# Patient Record
Sex: Male | Born: 1950
Health system: Southern US, Community
[De-identification: ages and names within clinical notes are randomized; demographics above are authoritative.]

## PROBLEM LIST (undated history)

## (undated) DIAGNOSIS — R251 Tremor, unspecified: Secondary | ICD-10-CM

## (undated) DIAGNOSIS — E7800 Pure hypercholesterolemia, unspecified: Secondary | ICD-10-CM

## (undated) HISTORY — DX: Pure hypercholesterolemia, unspecified: E78.00

## (undated) HISTORY — DX: Tremor, unspecified: R25.1

---
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
## 2004-10-02 ENCOUNTER — Encounter: Admission: RE | Admit: 2004-10-02 | Discharge: 2004-11-15 | Payer: Self-pay | Admitting: Neurosurgery

## 2015-09-17 HISTORY — PX: COLONOSCOPY: SHX174

## 2016-01-22 ENCOUNTER — Encounter: Payer: Self-pay | Admitting: Neurology

## 2016-01-22 ENCOUNTER — Ambulatory Visit (INDEPENDENT_AMBULATORY_CARE_PROVIDER_SITE_OTHER): Payer: BLUE CROSS/BLUE SHIELD | Admitting: Neurology

## 2016-01-22 ENCOUNTER — Telehealth: Payer: Self-pay | Admitting: Neurology

## 2016-01-22 VITALS — BP 114/68 | HR 54 | Ht 66.0 in | Wt 185.0 lb

## 2016-01-22 DIAGNOSIS — R251 Tremor, unspecified: Secondary | ICD-10-CM | POA: Insufficient documentation

## 2016-01-22 NOTE — Telephone Encounter (Signed)
John Pace called 8:05am, is running late for 8:00am arrival time for 8:30am appointment, due to traffice, will be here in approximately 5-10 minutes.

## 2016-01-22 NOTE — Patient Instructions (Signed)
Tremor °A tremor is trembling or shaking that you cannot control. Most tremors affect the hands or arms. Tremors can also affect the head, vocal cords, face, and other parts of the body. There are many types of tremors. Common types include:  °· Essential tremor. These usually occur in people over the age of 40. It may run in families and can happen in otherwise healthy people.   °· Resting tremor. These occur when the muscles are at rest, such as when your hands are resting in your lap. People with Parkinson disease often have resting tremors.   °· Postural tremor. These occur when you try to hold a pose, such as keeping your hands outstretched.   °· Kinetic tremor. These occur during purposeful movement, such as trying to touch a finger to your nose.   °· Task-specific tremor. These may occur when you perform tasks such as handwriting, speaking, or standing.   °· Psychogenic tremor. These dramatically lessen or disappear when you are distracted. They can happen in people of all ages.   °Some types of tremors have no known cause. Tremors can also be a symptom of nervous system problems (neurological disorders) that may occur with aging. Some tremors go away with treatment while others do not.  °HOME CARE INSTRUCTIONS °Watch your tremor for any changes. The following actions may help to lessen any discomfort you are feeling: °· Take medicines only as directed by your health care provider.   °· Limit alcohol intake to no more than 1 drink per day for nonpregnant women and 2 drinks per day for men. One drink equals 12 oz of beer, 5 oz of wine, or 1½ oz of hard liquor. °· Do not use any tobacco products, including cigarettes, chewing tobacco, or electronic cigarettes. If you need help quitting, ask your health care provider.   °· Avoid extreme heat or cold.    °· Limit the amount of caffeine you consume as directed by your health care provider.   °· Try to get 8 hours of sleep each night. °· Find ways to manage your  stress, such as meditation or yoga. °· Keep all follow-up visits as directed by your health care provider. This is important. °SEEK MEDICAL CARE IF: °· You start having a tremor after starting a new medicine. °· You have tremor with other symptoms such as: °¨ Numbness. °¨ Tingling. °¨ Pain. °¨ Weakness. °· Your tremor gets worse. °· Your tremor interferes with your day-to-day life. °  °This information is not intended to replace advice given to you by your health care provider. Make sure you discuss any questions you have with your health care provider. °  °Document Released: 08/23/2002 Document Revised: 09/23/2014 Document Reviewed: 02/28/2014 °Elsevier Interactive Patient Education ©2016 [employer] ° °

## 2016-01-22 NOTE — Progress Notes (Signed)
Reason for visit: Tremor  Referring physician: Dr. Margarita Rana is a 65 y.o. male  History of present illness:  John Pace is a 65 year old right-handed white male with a history of tremor that he has noticed off and on over the last 3-4 months. The patient indicates that the tremor is not present at all times, he will generally note the tremor more often when he is resting his arms, usually more on the right arm than the left arm. Occasionally he may note some tremor when he is trying to comb his hair. He oftentimes will have a tremor while he is driving a car. He indicates that he has had no real change in handwriting, he denies any issues with feeding himself. He has not noted any change in his walking, balance, and no problems with speech or swallowing. He denies issues controlling the bowels or the bladder. He denies any family history of tremor other than the fact that his father had some tremor after a stroke when he was in his 90s. Otherwise, the patient has been quite healthy, he is essentially on no medications. He is sent to this office for an evaluation. He denies any tremor of the head and neck, legs, or any vocal tremor.  Past Medical History  Diagnosis Date  . Tremor   . High cholesterol     Past Surgical History  Procedure Laterality Date  . Stomach surgery  1952    pyloic stenosis  . Colonoscopy  2017    Family History  Problem Relation Age of Onset  . Stroke Father   . Lung cancer Mother     Social history:  reports that he has never smoked. He does not have any smokeless tobacco history on file. He reports that he drinks alcohol. He reports that he does not use illicit drugs.  Medications:  Prior to Admission medications   Not on File     No Known Allergies  ROS:  Out of a complete 14 system review of symptoms, the patient complains only of the following symptoms, and all other reviewed systems are negative.  Tremor  Blood pressure 114/68,  pulse 54, height 5\' 6"  (1.676 m), weight 185 lb (83.915 kg).  Physical Exam  General: The patient is alert and cooperative at the time of the examination.  Eyes: Pupils are equal, round, and reactive to light. Discs are flat bilaterally.  Neck: The neck is supple, no carotid bruits are noted.  Respiratory: The respiratory examination is clear.  Cardiovascular: The cardiovascular examination reveals a regular rate and rhythm, no obvious murmurs or rubs are noted.  Skin: Extremities are without significant edema.  Neurologic Exam  Mental status: The patient is alert and oriented x 3 at the time of the examination. The patient has apparent normal recent and remote memory, with an apparently normal attention span and concentration ability.  Cranial nerves: Facial symmetry is present. There is good sensation of the face to pinprick and soft touch bilaterally. The strength of the facial muscles and the muscles to head turning and shoulder shrug are normal bilaterally. Speech is well enunciated, no aphasia or dysarthria is noted. Extraocular movements are full. Visual fields are full. The tongue is midline, and the patient has symmetric elevation of the soft palate. No obvious hearing deficits are noted.  Motor: The motor testing reveals 5 over 5 strength of all 4 extremities. Good symmetric motor tone is noted throughout.  Sensory: Sensory testing is intact  to pinprick, soft touch, vibration sensation, and position sense on all 4 extremities. No evidence of extinction is noted.  Coordination: Cerebellar testing reveals good finger-nose-finger and heel-to-shin bilaterally. At times, a resting tremor involving the right upper extremity is seen.  Gait and station: Gait is normal. There is good arm swing bilaterally, there is a question of minimal depression of arm swing on the right as compared to the left. Tandem gait is normal. Romberg is negative. No drift is seen.  Reflexes: Deep tendon  reflexes are symmetric and normal bilaterally. Toes are downgoing bilaterally.   Assessment/Plan:  1. Resting tremor, right upper extremity  The patient has a resting tremor involving the right arm, no other associated abnormalities on clinical examination. The etiology of the tremor is not clear, MRI of the brain will be set up. The patient will need to be followed over time looking for developing symptoms of parkinsonism. He will follow-up in 6 months.   John Alexanders MD 01/22/2016 7:47 PM  [facility] [address] [place], [place] [address]  Phone [phone] Fax [phone]

## 2016-02-14 ENCOUNTER — Other Ambulatory Visit: Payer: BLUE CROSS/BLUE SHIELD

## 2016-02-14 ENCOUNTER — Ambulatory Visit (INDEPENDENT_AMBULATORY_CARE_PROVIDER_SITE_OTHER): Payer: BLUE CROSS/BLUE SHIELD

## 2016-02-14 DIAGNOSIS — R251 Tremor, unspecified: Secondary | ICD-10-CM

## 2016-02-16 ENCOUNTER — Telehealth: Payer: Self-pay | Admitting: Neurology

## 2016-02-16 NOTE — Telephone Encounter (Signed)
I called the patient. MRI the brain is essentially normal, very very minimal white matter changes. No etiology for tremor seen. The patient has a true unilateral resting tremor, will follow over time looking for developing signs of parkinsonism. I discussed this with the patient.   MRI brain 02/15/16:  IMPRESSION:  Equivocal MRI brain (without) demonstrating: 1. Minimal periventricular and subcortical foci of non-specific gliosis.  2. No acute findings.

## 2016-07-24 ENCOUNTER — Encounter: Payer: Self-pay | Admitting: Neurology

## 2016-07-24 ENCOUNTER — Ambulatory Visit (INDEPENDENT_AMBULATORY_CARE_PROVIDER_SITE_OTHER): Payer: BLUE CROSS/BLUE SHIELD | Admitting: Neurology

## 2016-07-24 VITALS — BP 110/58 | HR 52 | Resp 16 | Ht 66.0 in | Wt 188.0 lb

## 2016-07-24 DIAGNOSIS — R251 Tremor, unspecified: Secondary | ICD-10-CM | POA: Diagnosis not present

## 2016-07-24 NOTE — Progress Notes (Signed)
Reason for visit: Tremor  John Pace is an 65 y.o. male  History of present illness:  John Pace is a 65 year old right-handed white male with a history of a tremor. The patient indicates that the tremor affects both hands equally, he will notice it with handwriting and with holding objects. If he relaxes completely, the tremors go away. He will have good days and bad days with the tremor. If he is under some stress, such as giving a talk to other individuals at work, the tremor may be more prominent. The patient has no problem using a computer, he has not had to alter any of his day-to-day functioning because of the tremor. He now indicates that his father had a tremor late in life. The patient returns to this office for an evaluation. The patient reports no problems walking or with balance. There have been no changes in speech or swallowing.  Past Medical History:  Diagnosis Date  . High cholesterol   . Tremor     Past Surgical History:  Procedure Laterality Date  . COLONOSCOPY  2017  . STOMACH SURGERY  1952   pyloic stenosis    Family History  Problem Relation Age of Onset  . Stroke Father   . Lung cancer Mother     Social history:  reports that he has never smoked. He does not have any smokeless tobacco history on file. He reports that he drinks alcohol. He reports that he does not use drugs.   No Known Allergies  Medications:  Prior to Admission medications   Medication Sig Start Date End Date Taking? Authorizing Provider  ibuprofen (ADVIL,MOTRIN) 200 MG tablet Take 200 mg by mouth every 8 (eight) hours as needed.   Yes Historical Provider, MD    ROS:  Out of a complete 14 system review of symptoms, the patient complains only of the following symptoms, and all other reviewed systems are negative.  Tremor  Blood pressure (!) 110/58, pulse (!) 52, resp. rate 16, height 5\' 6"  (1.676 m), weight 188 lb (85.3 kg).  Physical Exam  General: The patient is alert and  cooperative at the time of the examination.  Skin: No significant peripheral edema is noted.   Neurologic Exam  Mental status: The patient is alert and oriented x 3 at the time of the examination. The patient has apparent normal recent and remote memory, with an apparently normal attention span and concentration ability.   Cranial nerves: Facial symmetry is present. Speech is normal, no aphasia or dysarthria is noted. Extraocular movements are full. Visual fields are full.  Motor: The patient has good strength in all 4 extremities.  Sensory examination: Soft touch sensation is symmetric on the face, arms, and legs.  Coordination: The patient has good finger-nose-finger and heel-to-shin bilaterally. Mild tremors are seen in both hands with arms outstretched. The tremor is not translated into drawing a spiral.  Gait and station: The patient has a normal gait. Tandem gait is normal. Romberg is negative. No drift is seen.  Reflexes: Deep tendon reflexes are symmetric.   Assessment/Plan:  1. Probable essential tremor  There is no evidence of parkinsonism. The patient reports good symmetry of the tremor, the tremor comes on with activity involving the arms, goes away with rest. The tremor appears to conform to an essential tremor. The tremor is quite minimal at this time, I will have the patient contact me if he believes that he will require treatment. The patient will follow-up if needed.  Jill Alexanders MD 07/24/2016 8:02 AM  [facility] [address] [place], [place] [address]  Phone [phone] Fax [phone]

## 2016-10-07 ENCOUNTER — Telehealth: Payer: Self-pay | Admitting: Neurology

## 2016-10-07 MED ORDER — PREDNISONE 10 MG PO TABS: ORAL_TABLET | ORAL | 0 refills | Status: DC

## 2016-10-07 NOTE — Telephone Encounter (Signed)
Patient is calling stating he is has been having pain in his lower back since 09-23-16. He went to see his PCP on 09-23-16 and was given steroids. The pain was so bad then he could not walk. He went to an urgent care on 10-05-16 because his PCP was not in the office and was given  a muscle relaxer. The patient is still in severe pain and is concerned this may be related to tremors. Please call and discuss.

## 2016-10-07 NOTE — Telephone Encounter (Signed)
Pt was seen for OV in Nov for essential-type tremor of the arms. Now c/o severe low back pain despite prednisone, cyclobenzaprine, methocarbamol/ibuprofen.

## 2016-10-07 NOTE — Addendum Note (Signed)
Addended by: Margette Fast on: 10/07/2016 10:29 AM   Modules accepted: Orders

## 2016-10-07 NOTE — Telephone Encounter (Signed)
I called patient. The patient has had back pain over the last couple weeks, a prednisone Dosepak initially helped him, but the pain came back after coming off the medication, he is also on Robaxin. I will repeat the dose pack of prednisone, and get him in for a work in revisit.  The patient denies any discomfort going down either leg.

## 2016-10-09 NOTE — Telephone Encounter (Signed)
I called patient. He is feeling somewhat better on the prednisone, okay to enter into some light walking.

## 2016-10-09 NOTE — Telephone Encounter (Signed)
Called pt and appt scheduled next Tues. Says that he started prednisone yesterday and it has helped with the back pain. However, he's having a twinge-type sensation in his legs that began the day before starting the dosepak. Mentioned that he had stopped walking on the treadmill d/t severe back pain. Would like Dr. Jannifer Franklin' advice on whether to re-start his exercise program since pain has improved.

## 2016-10-15 ENCOUNTER — Ambulatory Visit (INDEPENDENT_AMBULATORY_CARE_PROVIDER_SITE_OTHER): Payer: BLUE CROSS/BLUE SHIELD | Admitting: Neurology

## 2016-10-15 ENCOUNTER — Encounter: Payer: Self-pay | Admitting: Neurology

## 2016-10-15 VITALS — BP 123/65 | HR 54 | Ht 66.0 in | Wt 181.0 lb

## 2016-10-15 DIAGNOSIS — M545 Low back pain, unspecified: Secondary | ICD-10-CM | POA: Insufficient documentation

## 2016-10-15 DIAGNOSIS — R251 Tremor, unspecified: Secondary | ICD-10-CM

## 2016-10-15 NOTE — Progress Notes (Signed)
Reason for visit: Low back pain  John Pace is an 66 y.o. male  History of present illness:  John Pace is a 66 year old right-handed white male with a history of a mild essential tremor. The tremor affects both upper extremities, and has so far not affected his ability to function day to day. Within the last 3 or 4 weeks, he has developed a new problem which involves low back pain. He has had neck and back pain years ago that resolved with traction and with epidural steroid injections. The patient has had onset of severe back pain mainly on the right side of the low back, the patient may have some occasional tingling sensations into the anterior thighs bilaterally without any weakness of the extremities. The patient denies any difficulty controlling the bowels or the bladder. The patient has not had any true weakness of the legs, he has not had any balance issues. The patient was placed on a prednisone dosepak and he was placed on Robaxin as a muscle relaxer which initially seemed to help, but when he went off of the prednisone the pain returned. The patient has been placed back on a prednisone dosepak, he is still on the pack, and he is now pain-free. The patient is getting back into low-grade walking exercises. He returns this office for an evaluation.  Past Medical History:  Diagnosis Date  . High cholesterol   . Tremor     Past Surgical History:  Procedure Laterality Date  . COLONOSCOPY  2017  . STOMACH SURGERY  1952   pyloic stenosis    Family History  Problem Relation Age of Onset  . Stroke Father   . Lung cancer Mother     Social history:  reports that he has never smoked. He has never used smokeless tobacco. He reports that he drinks alcohol. He reports that he does not use drugs.   No Known Allergies  Medications:  Prior to Admission medications   Medication Sig Start Date End Date Taking? Authorizing Provider  cyclobenzaprine (FLEXERIL) 10 MG tablet TAKE 1 TABLET  AS NEEDED TWO TIMES DAILY AS NEEDED ORALLY 10 DAYS 09/23/16  Yes Historical Provider, MD  ibuprofen (ADVIL,MOTRIN) 200 MG tablet Take 200 mg by mouth every 8 (eight) hours as needed.   Yes Historical Provider, MD  methocarbamol (ROBAXIN) 500 MG tablet Take 500 mg by mouth 2 (two) times daily. 10/04/16  Yes Historical Provider, MD  predniSONE (DELTASONE) 10 MG tablet Begin taking 6 tablets daily, taper by one tablet every other day until off the medication. 10/07/16  Yes Kathrynn Ducking, MD    ROS:  Out of a complete 14 system review of symptoms, the patient complains only of the following symptoms, and all other reviewed systems are negative.  Back pain Tremor  Blood pressure 123/65, pulse (!) 54, height 5\' 6"  (1.676 m), weight 181 lb (82.1 kg).  Physical Exam  General: The patient is alert and cooperative at the time of the examination.  Neuromuscular: Range of movement of the low back is relatively full.  Skin: No significant peripheral edema is noted.   Neurologic Exam  Mental status: The patient is alert and oriented x 3 at the time of the examination. The patient has apparent normal recent and remote memory, with an apparently normal attention span and concentration ability.   Cranial nerves: Facial symmetry is present. Speech is normal, no aphasia or dysarthria is noted. Extraocular movements are full. Visual fields are full.  Motor:  The patient has good strength in all 4 extremities.  Sensory examination: Soft touch sensation is symmetric on the face, arms, and legs.  Coordination: The patient has good finger-nose-finger and heel-to-shin bilaterally.  Gait and station: The patient has a normal gait. Tandem gait is normal. Romberg is negative. No drift is seen. The patient is able to walk on heels and the toes.  Reflexes: Deep tendon reflexes are symmetric.   Assessment/Plan:  1. Essential tremor  2. Low back pain  The patient will complete a prednisone dosepak, he  will go on Advil taking 600 mg 3 times a day for 2 weeks following the prednisone. He is now off of the muscle relaxants. If the pain returns, we will consider MRI of the low back. He will follow-up if needed.   John Alexanders MD 10/15/2016 7:53 AM  [facility] [address] [place], [place] [address]  Phone [phone] Fax [phone]

## 2017-01-21 ENCOUNTER — Ambulatory Visit: Payer: BLUE CROSS/BLUE SHIELD | Admitting: Adult Health

## 2017-03-31 ENCOUNTER — Ambulatory Visit (INDEPENDENT_AMBULATORY_CARE_PROVIDER_SITE_OTHER): Payer: BLUE CROSS/BLUE SHIELD | Admitting: Adult Health

## 2017-03-31 ENCOUNTER — Encounter (INDEPENDENT_AMBULATORY_CARE_PROVIDER_SITE_OTHER): Payer: Self-pay

## 2017-03-31 ENCOUNTER — Encounter: Payer: Self-pay | Admitting: Adult Health

## 2017-03-31 VITALS — BP 114/56 | HR 48 | Wt 180.0 lb

## 2017-03-31 DIAGNOSIS — G250 Essential tremor: Secondary | ICD-10-CM

## 2017-03-31 NOTE — Progress Notes (Signed)
I have read the note, and I agree with the clinical assessment and plan.  Fancy Dunkley KEITH   

## 2017-03-31 NOTE — Progress Notes (Signed)
PATIENT: John Pace DOB: [birth year]  REASON FOR VISIT: follow up- essential tremor HISTORY FROM: patient- and wife  HISTORY OF PRESENT ILLNESS: Today 03/31/17 John Pace is a 66 year old male with a history of a mild essential tremor. He returns today for follow-up. He reports he feels the tremor has gotten slightly worse. He does not feel that it interrupts  his daily life other than he notices it more. He states that the tremor primarily affects the upper extremities. He notices it when he is eating, writing and holding a paper to read. He has noticed that the tremor is slightly worse when he is under stress or anxious. He is currently not taking any medication. At the last visit he was having back pain-he reports that this has resolved. He returns today for an evaluation.  HISTORY 10/15/16 John Pace is a 66 year old right-handed white male with a history of a mild essential tremor. The tremor affects both upper extremities, and has so far not affected his ability to function day to day. Within the last 3 or 4 weeks, he has developed a new problem which involves low back pain. He has had neck and back pain years ago that resolved with traction and with epidural steroid injections. The patient has had onset of severe back pain mainly on the right side of the low back, the patient may have some occasional tingling sensations into the anterior thighs bilaterally without any weakness of the extremities. The patient denies any difficulty controlling the bowels or the bladder. The patient has not had any true weakness of the legs, he has not had any balance issues. The patient was placed on a prednisone dosepak and he was placed on Robaxin as a muscle relaxer which initially seemed to help, but when he went off of the prednisone the pain returned. The patient has been placed back on a prednisone dosepak, he is still on the pack, and he is now pain-free. The patient is getting back into low-grade walking  exercises. He returns this office for an evaluation. :   REVIEW OF SYSTEMS: Out of a complete 14 system review of symptoms, the patient complains only of the following symptoms, and all other reviewed systems are negative.  Tremors  ALLERGIES: No Known Allergies  HOME MEDICATIONS: Outpatient Medications Prior to Visit  Medication Sig Dispense Refill  . ibuprofen (ADVIL,MOTRIN) 200 MG tablet Take 200 mg by mouth every 8 (eight) hours as needed.    . cyclobenzaprine (FLEXERIL) 10 MG tablet TAKE 1 TABLET AS NEEDED TWO TIMES DAILY AS NEEDED ORALLY 10 DAYS  0  . methocarbamol (ROBAXIN) 500 MG tablet Take 500 mg by mouth 2 (two) times daily.  0  . predniSONE (DELTASONE) 10 MG tablet Begin taking 6 tablets daily, taper by one tablet every other day until off the medication. 42 tablet 0   No facility-administered medications prior to visit.     PAST MEDICAL HISTORY: Past Medical History:  Diagnosis Date  . High cholesterol   . Tremor     PAST SURGICAL HISTORY: Past Surgical History:  Procedure Laterality Date  . COLONOSCOPY  2017  . STOMACH SURGERY  1952   pyloic stenosis    FAMILY HISTORY: Family History  Problem Relation Age of Onset  . Stroke Father   . Lung cancer Mother     SOCIAL HISTORY: Social History   Social History  . Marital status: Married    Spouse name: Peri  . Number of children: 4  .  Years of education: 34   Occupational History  . Tree surgeon for [employer]  . Smoking status: Never Smoker  . Smokeless tobacco: Never Used  . Alcohol use 0.0 oz/week     Comment: 1 beer per week  . Drug use: No  . Sexual activity: Not on file   Other Topics Concern  . Not on file   Social History Narrative   Lives at home w/ his wife and 10 yr old daughter   Right-handed   Drinks about 8 oz coffee daily      PHYSICAL EXAM  Vitals:   03/31/17 0753  BP: (!) 114/56  Pulse: (!) 48  Weight: 180 lb (81.6 kg)    Body mass index is 29.05 kg/m.  Generalized: Well developed, in no acute distress   Neurological examination  Mentation: Alert oriented to time, place, history taking. Follows all commands speech and language fluent Cranial nerve II-XII: Pupils were equal round reactive to light. Extraocular movements were full, visual field were full on confrontational test. Facial sensation and strength were normal. Uvula tongue midline. Head turning and shoulder shrug  were normal and symmetric. Motor: The motor testing reveals 5 over 5 strength of all 4 extremities. Good symmetric motor tone is noted throughout.  Sensory: Sensory testing is intact to soft touch on all 4 extremities. No evidence of extinction is noted.  Coordination: Cerebellar testing reveals good finger-nose-finger and heel-to-shin bilaterally.Very mild intention tremor noted in the upper extremities.  Gait and station: Gait is normal. Tandem gait is normal. Romberg is negative. No drift is seen.  Reflexes: Deep tendon reflexes are symmetric and normal bilaterally.   DIAGNOSTIC DATA (LABS, IMAGING, TESTING) - I reviewed patient records, labs, notes, testing and imaging myself where available.    ASSESSMENT AND PLAN 66 y.o. year old male  has a past medical history of High cholesterol and Tremor. here with:  1. Essential tremor  The patient has a very mild tremor. We discussed starting primidone however the patient does not want to try medication at this time. I have advised that if his symptoms worsen or he develops new symptoms he should let us know. He will follow-up in 6 months or sooner if needed.  I spent 15 minutes with the patient. 50% of this time was spent discussing medication primidone.      Ward Givens, MSN, NP-C 03/31/2017, 8:10 AM [facility] [address], [place], [place] [address] [phone]

## 2017-03-31 NOTE — Patient Instructions (Signed)
Your Plan:  We will continue to monitor  Consider Primidone in the future if tremor worsens  Thank you for coming to see [place] at [facility]. I hope we have been able to provide you high quality care today.  You may receive a patient satisfaction survey over the next few weeks. We would appreciate your feedback and comments so that we may continue to improve ourselves and the health of our patients.  Primidone tablets What is this medicine? PRIMIDONE (PRI mi done) is a barbiturate. This medicine is used to control seizures in certain types of epilepsy. It is not for use in absence (petit mal) seizures. This medicine may be used for other purposes; ask your health care provider or pharmacist if you have questions. COMMON BRAND NAME(S): Mysoline What should I tell my health care provider before I take this medicine? They need to know if you have any of these conditions: -kidney disease -liver disease -porphyria -suicidal thoughts, plans, or attempt; a previous suicide attempt by you or a family member -an unusual or allergic reaction to primidone, phenobarbital, other barbiturates or seizure medications, other medicines, foods, dyes, or preservatives -pregnant or trying to get pregnant -breast-feeding How should I use this medicine? Take this medicine by mouth with a glass of water. Follow the directions on the prescription label. Take your doses at regular intervals. Do not take your medicine more often than directed. Do not stop taking except on the advice of your doctor or health care professional. A special MedGuide will be given to you by the pharmacist with each prescription and refill. Be sure to read this information carefully each time. Contact your pediatrician or health care professional regarding the use of this medication in children. Special care may be needed. While this drug may be prescribed for children for selected conditions, precautions do  apply. Overdosage: If you think you have taken too much of this medicine contact a poison control center or emergency room at once. NOTE: This medicine is only for you. Do not share this medicine with others. What if I miss a dose? If you miss a dose, take it as soon as you can. If it is almost time for your next dose, take only that dose. Do not take double or extra doses. What may interact with this medicine? Do not take this medicine with any of the following medications: -voriconazole This medicine may also interact with the following medications: -cancer-treating medications -cyclosporine -disopyramide -doxycycline -male hormones, including contraceptive or birth control pills -medicines for mental depression, anxiety or other mood problems -medicines for treating HIV infection or AIDS -modafinil -prescription pain medications -quinidine -warfarin This list may not describe all possible interactions. Give your health care provider a list of all the medicines, herbs, non-prescription drugs, or dietary supplements you use. Also tell them if you smoke, drink alcohol, or use illegal drugs. Some items may interact with your medicine. What should I watch for while using this medicine? Visit your doctor or health care professional for regular checks on your progress. It may be 2 to 3 weeks before you see the full effects of this medicine. Do not suddenly stop taking this medicine, you may increase the risk of seizures. Your doctor or health care professional may want to gradually reduce the dose. Wear a medical identification bracelet or chain to say you have epilepsy, and carry a card that lists all your medications. You may get drowsy or dizzy. Do not drive, use machinery, or do anything  that needs mental alertness until you know how this medicine affects you. Do not stand or sit up quickly, especially if you are an older patient. This reduces the risk of dizzy or fainting spells. Alcohol  may interfere with the effect of this medicine. Avoid alcoholic drinks. Birth control pills may not work properly while you are taking this medicine. Talk to your doctor about using an extra method of birth control. The use of this medicine may increase the chance of suicidal thoughts or actions. Pay special attention to how you are responding while on this medicine. Any worsening of mood, or thoughts of suicide or dying should be reported to your health care professional right away. Women who become pregnant while using this medicine may enroll in the [place] Pregnancy Registry by calling [phone]. This registry collects information about the safety of antiepileptic drug use during pregnancy. What side effects may I notice from receiving this medicine? Side effects that you should report to your doctor or health care professional as soon as possible: -allergic reactions like skin rash, itching or hives, swelling of the face, lips, or tongue -blurred, double vision, or uncontrollable rolling or movements of the eyes -redness, blistering, peeling or loosening of the skin, including inside the mouth -shortness of breath or difficulty breathing -unusual excitement or restlessness, more likely in children and the elderly -unusually weak or tired -worsening of mood, thoughts or actions of suicide or dying Side effects that usually do not require medical attention (report to your doctor or health care professional if they continue or are bothersome): -clumsiness, unsteadiness, or a hang-over effect -decreased sexual ability -dizziness, drowsiness -loss of appetite -nausea or vomiting This list may not describe all possible side effects. Call your doctor for medical advice about side effects. You may report side effects to FDA at 1-800-FDA-1088. Where should I keep my medicine? Keep out of the reach of children. This medicine may cause accidental overdose and death if  it taken by other adults, children, or pets. Mix any unused medicine with a substance like cat litter or coffee grounds. Then throw the medicine away in a sealed container like a sealed bag or a coffee can with a lid. Do not use the medicine after the expiration date. Store at room temperature between 15 and 30 degrees C (59 and 86 degrees F). NOTE: This sheet is a summary. It may not cover all possible information. If you have questions about this medicine, talk to your doctor, pharmacist, or health care provider.  2018 Elsevier/Gold Standard (2013-10-29 15:40:08)

## 2017-09-29 ENCOUNTER — Ambulatory Visit: Payer: BLUE CROSS/BLUE SHIELD | Admitting: Adult Health

## 2017-09-29 ENCOUNTER — Telehealth: Payer: Self-pay | Admitting: *Deleted

## 2017-09-29 NOTE — Telephone Encounter (Signed)
Spoke with patient's wife yesterday evening to cancel patient's appt due to inclement weather. No show fee will not apply. 9:10 AM Spoke with patient this morning and rescheduled for soonest available. Patient stated he is experiencing some worsening of his tremors. This RN placed him on wait list, high priority. Will send this note to NP.

## 2017-09-29 NOTE — Telephone Encounter (Signed)
Noted  

## 2017-11-06 ENCOUNTER — Encounter (INDEPENDENT_AMBULATORY_CARE_PROVIDER_SITE_OTHER): Payer: Self-pay

## 2017-11-06 ENCOUNTER — Ambulatory Visit: Payer: BLUE CROSS/BLUE SHIELD | Admitting: Adult Health

## 2017-11-06 ENCOUNTER — Encounter: Payer: Self-pay | Admitting: Adult Health

## 2017-11-06 VITALS — BP 105/56 | HR 52 | Ht 66.0 in | Wt 180.0 lb

## 2017-11-06 DIAGNOSIS — G250 Essential tremor: Secondary | ICD-10-CM | POA: Diagnosis not present

## 2017-11-06 NOTE — Patient Instructions (Signed)
Your Plan:  Continue to  Monitor tremor If your symptoms worsen or you develop new symptoms please let us know.   Thank you for coming to see [place] at [facility]. I hope we have been able to provide you high quality care today.  You may receive a patient satisfaction survey over the next few weeks. We would appreciate your feedback and comments so that we may continue to improve ourselves and the health of our patients.

## 2017-11-06 NOTE — Progress Notes (Signed)
I have read the note, and I agree with the clinical assessment and plan.  Charles K Willis   

## 2017-11-06 NOTE — Progress Notes (Signed)
PATIENT: John Pace DOB: [birth year]  REASON FOR VISIT: follow up HISTORY FROM: patient  HISTORY OF PRESENT ILLNESS: Today 11/06/17 John Pace is a 67 year old male with a history of probable essential tremor.  He returns today for follow-up.  He reports that his tremor has remained relatively the same.  He does state that he tends to notice the tremor more.  He states that he notices it with his handwriting and when he is holding his phone.  He denies any trouble completing ADLs.  He continues to work full-time.  He states occasionally if he is walking a long distance he finds that sometimes his right great toe will get caught on the floor.  He states that this is not consistent and typically only occurs when he has been walking for long distances.  He denies any new neurological symptoms.  He returns today for evaluation.  HISTORY  03/31/17 John Pace is a 67 year old male with a history of a mild essential tremor. He returns today for follow-up. He reports he feels the tremor has gotten slightly worse. He does not feel that it interrupts  his daily life other than he notices it more. He states that the tremor primarily affects the upper extremities. He notices it when he is eating, writing and holding a paper to read. He has noticed that the tremor is slightly worse when he is under stress or anxious. He is currently not taking any medication. At the last visit he was having back pain-he reports that this has resolved. He returns today for an evaluation.  HISTORY 10/15/16 John Pace is a 67 year old right-handed white male with a history of a mild essential tremor. The tremor affects both upper extremities, and has so far not affected his ability to function day to day. Within the last 3 or 4 weeks, he has developed a new problem which involves low back pain. He has had neck and back pain years ago that resolved with traction and with epidural steroid injections. The patient has had onset of  severe back pain mainlyon the right side of the low back, the patient may have some occasional tingling sensations into the anterior thighs bilaterally without any weakness of the extremities. The patient denies any difficulty controlling the bowels or the bladder. The patient has not had any true weakness of the legs, he has not had any balance issues. The patient was placed on a prednisone dosepak and he was placed on Robaxin as a muscle relaxer which initially seemed to help, but when he went off of the prednisone the pain returned. The patient has been placed back on a prednisone dosepak, he is still on the pack, and he is now pain-free. The patient is getting back into low-grade walking exercises. He returns this office for an evaluation. :   REVIEW OF SYSTEMS: Out of a complete 14 system review of symptoms, the patient complains only of the following symptoms, and all other reviewed systems are negative.  See HPI  ALLERGIES: No Known Allergies  HOME MEDICATIONS: Outpatient Medications Prior to Visit  Medication Sig Dispense Refill  . ibuprofen (ADVIL,MOTRIN) 200 MG tablet Take 200 mg by mouth every 8 (eight) hours as needed.     No facility-administered medications prior to visit.     PAST MEDICAL HISTORY: Past Medical History:  Diagnosis Date  . High cholesterol   . Tremor     PAST SURGICAL HISTORY: Past Surgical History:  Procedure Laterality Date  . COLONOSCOPY  2017  .  STOMACH SURGERY  1952   pyloic stenosis    FAMILY HISTORY: Family History  Problem Relation Age of Onset  . Stroke Father   . Lung cancer Mother     SOCIAL HISTORY: Social History   Socioeconomic History  . Marital status: Married    Spouse name: Peri  . Number of children: 4  . Years of education: 64  . Highest education level: Not on file  Social Needs  . Financial resource strain: Not on file  . Food insecurity - worry: Not on file  . Food insecurity - inability: Not on file  .  Transportation needs - medical: Not on file  . Transportation needs - non-medical: Not on file  Occupational History  . Occupation: Tree surgeon for [employer]  Tobacco Use  . Smoking status: Never Smoker  . Smokeless tobacco: Never Used  Substance and Sexual Activity  . Alcohol use: Yes    Alcohol/week: 0.0 oz    Comment: 1 beer per week  . Drug use: No  . Sexual activity: Not on file  Other Topics Concern  . Not on file  Social History Narrative   Lives at home w/ his wife and 35 yr old daughter   Right-handed   Drinks about 8 oz coffee daily      PHYSICAL EXAM  Vitals:   11/06/17 0716  BP: (!) 105/56  Pulse: (!) 52  Weight: 180 lb (81.6 kg)  Height: 5\' 6"  (1.676 m)   Body mass index is 29.05 kg/m.  Generalized: Well developed, in no acute distress   Neurological examination  Mentation: Alert oriented to time, place, history taking. Follows all commands speech and language fluent Cranial nerve II-XII: Pupils were equal round reactive to light. Extraocular movements were full, visual field were full on confrontational test. Facial sensation and strength were normal. Uvula tongue midline. Head turning and shoulder shrug  were normal and symmetric. Motor: The motor testing reveals 5 over 5 strength of all 4 extremities. Good symmetric motor tone is noted throughout.  Sensory: Sensory testing is intact to soft touch on all 4 extremities. No evidence of extinction is noted.  Coordination: Cerebellar testing reveals good finger-nose-finger and heel-to-shin bilaterally.  Bilateral action tremor noted in upper extremities. Gait and station: Gait is normal. Tandem gait is normal. Romberg is negative. No drift is seen.  Reflexes: Deep tendon reflexes are symmetric and normal bilaterally.   DIAGNOSTIC DATA (LABS, IMAGING, TESTING) - I reviewed patient records, labs, notes, testing and imaging myself where available.     ASSESSMENT AND PLAN 67 y.o. year old male  has  a past medical history of High cholesterol and Tremor. here with :  1.  Essential tremor  Overall the patient is doing well.  He is currently not on any medication for his tremor.  Tremor still seems to be consistent with an essential tremor however we will continue to monitor for symptoms of Parkinson's.  He is advised that if his symptoms worsen or he develops new symptoms he should let us know.  He will follow-up in 6 months or sooner if needed.    Ward Givens, MSN, NP-C 11/06/2017, 7:14 AM [facility] [address] [place], [place] [address] [phone]

## 2018-05-07 ENCOUNTER — Encounter: Payer: Self-pay | Admitting: Adult Health

## 2018-05-07 ENCOUNTER — Ambulatory Visit: Payer: Medicare HMO | Admitting: Adult Health

## 2018-05-07 VITALS — BP 112/66 | HR 50 | Ht 66.0 in | Wt 183.0 lb

## 2018-05-07 DIAGNOSIS — G250 Essential tremor: Secondary | ICD-10-CM | POA: Diagnosis not present

## 2018-05-07 NOTE — Progress Notes (Signed)
I have read the note, and I agree with the clinical assessment and plan.  Yazeed Pryer K Moiz Ryant   

## 2018-05-07 NOTE — Progress Notes (Signed)
PATIENT: Eliseo Gum DOB: 1951/04/04  REASON FOR VISIT: follow up HISTORY FROM: patient  HISTORY OF PRESENT ILLNESS: Today 05/07/18:  Mr. Reierson is a 67 year old male with a history of.  He returns today for follow-up.  He feels that his tremor has remained stable.  He notices it primarily with his handwriting and when he is holding objects in his hand.  Tremor seems to be worse in the right and the left hand.  He is able to complete all ADLs independently.  He is now retired.  He denies any significant changes with his gait or balance.  He reports on occasion he will stumble but this is rare denies any falls.  No trouble swallowing.  No rigidity.  He returns today for evaluation.  HISTORY 11/06/17:  11/06/17 Mr. Molner is a 67 year old male with a history of probable essential tremor.  He returns today for follow-up.  He reports that his tremor has remained relatively the same.  He does state that he tends to notice the tremor more.  He states that he notices it with his handwriting and when he is holding his phone.  He denies any trouble completing ADLs.  He continues to work full-time.  He states occasionally if he is walking a long distance he finds that sometimes his right great toe will get caught on the floor.  He states that this is not consistent and typically only occurs when he has been walking for long distances.  He denies any new neurological symptoms.  He returns today for evaluation.  REVIEW OF SYSTEMS: Out of a complete 14 system review of symptoms, the patient complains only of the following symptoms, and all other reviewed systems are negative.  ALLERGIES: No Known Allergies  HOME MEDICATIONS: Outpatient Medications Prior to Visit  Medication Sig Dispense Refill  . ibuprofen (ADVIL,MOTRIN) 200 MG tablet Take 200 mg by mouth every 8 (eight) hours as needed.     No facility-administered medications prior to visit.     PAST MEDICAL HISTORY: Past Medical History:    Diagnosis Date  . High cholesterol   . Tremor     PAST SURGICAL HISTORY: Past Surgical History:  Procedure Laterality Date  . COLONOSCOPY  2017  . STOMACH SURGERY  1952   pyloic stenosis    FAMILY HISTORY: Family History  Problem Relation Age of Onset  . Stroke Father   . Lung cancer Mother     SOCIAL HISTORY: Social History   Socioeconomic History  . Marital status: Married    Spouse name: Peri  . Number of children: 4  . Years of education: 7  . Highest education level: Not on file  Occupational History  . Occupation: Tree surgeon for [employer]  . Financial resource strain: Not on file  . Food insecurity:    Worry: Not on file    Inability: Not on file  . Transportation needs:    Medical: Not on file    Non-medical: Not on file  Tobacco Use  . Smoking status: Never Smoker  . Smokeless tobacco: Never Used  Substance and Sexual Activity  . Alcohol use: Yes    Alcohol/week: 0.0 standard drinks    Comment: 1 beer per week  . Drug use: No  . Sexual activity: Not on file  Lifestyle  . Physical activity:    Days per week: Not on file    Minutes per session: Not on file  . Stress: Not on file  Relationships  . Social connections:    Talks on phone: Not on file    Gets together: Not on file    Attends religious service: Not on file    Active member of club or organization: Not on file    Attends meetings of clubs or organizations: Not on file    Relationship status: Not on file  . Intimate partner violence:    Fear of current or ex partner: Not on file    Emotionally abused: Not on file    Physically abused: Not on file    Forced sexual activity: Not on file  Other Topics Concern  . Not on file  Social History Narrative   Lives at home w/ his wife and 37 yr old daughter   Right-handed   Drinks about 8 oz coffee daily      PHYSICAL EXAM  Vitals:   05/07/18 0735  Weight: 183 lb (83 kg)  Height: 5\' 6"  (1.676 m)   Body  mass index is 29.54 kg/m.  Generalized: Well developed, in no acute distress   Neurological examination  Mentation: Alert oriented to time, place, history taking. Follows all commands speech and language fluent Cranial nerve II-XII: Pupils were equal round reactive to light. Extraocular movements were full, visual field were full on confrontational test. Facial sensation and strength were normal. Uvula tongue midline. Head turning and shoulder shrug  were normal and symmetric. Motor: The motor testing reveals 5 over 5 strength of all 4 extremities. Good symmetric motor tone is noted throughout.  Mild resting tremor noted in the left upper extremity. Sensory: Sensory testing is intact to soft touch on all 4 extremities. No evidence of extinction is noted.  Coordination: Cerebellar testing reveals good finger-nose-finger and heel-to-shin bilaterally.  Mild intention tremor noted in the upper extremities Gait and station: Gait is normal. Tandem gait is normal. Romberg is negative. No drift is seen.  Reflexes: Deep tendon reflexes are symmetric and normal bilaterally.   DIAGNOSTIC DATA (LABS, IMAGING, TESTING) - I reviewed patient records, labs, notes, testing and imaging myself where available.     ASSESSMENT AND PLAN 67 y.o. year old male  has a past medical history of High cholesterol and Tremor. here with :  1.  Essential tremor  The patient's tremor has remained relatively stable.  The patient does have a mixed between an essential tremor and a resting tremor.  No other signs to indicate Parkinson's.  We will continue to monitor.  If his symptoms worsen or he develops new symptoms he should let us know.  He will follow-up in 6 months or sooner if needed.   I spent 15 minutes with the patient. 50% of this time was spent reviewing his tremor   Ward Givens, MSN, NP-C 05/07/2018, 7:40 AM [facility] [address], [place], [place] [address] [phone]

## 2018-05-07 NOTE — Patient Instructions (Signed)
Your Plan:  Continue to monitor tremor If your symptoms worsen or you develop new symptoms please let us know.   Thank you for coming to see us at [facility]. I hope we have been able to provide you high quality care today.  You may receive a patient satisfaction survey over the next few weeks. We would appreciate your feedback and comments so that we may continue to improve ourselves and the health of our patients.  

## 2018-07-24 DIAGNOSIS — Z0000 Encounter for general adult medical examination without abnormal findings: Secondary | ICD-10-CM | POA: Diagnosis not present

## 2018-07-24 DIAGNOSIS — Z125 Encounter for screening for malignant neoplasm of prostate: Secondary | ICD-10-CM | POA: Diagnosis not present

## 2018-07-24 DIAGNOSIS — Z136 Encounter for screening for cardiovascular disorders: Secondary | ICD-10-CM | POA: Diagnosis not present

## 2018-07-24 DIAGNOSIS — G250 Essential tremor: Secondary | ICD-10-CM | POA: Diagnosis not present

## 2018-09-23 DIAGNOSIS — L570 Actinic keratosis: Secondary | ICD-10-CM | POA: Diagnosis not present

## 2018-09-23 DIAGNOSIS — Z85828 Personal history of other malignant neoplasm of skin: Secondary | ICD-10-CM | POA: Diagnosis not present

## 2018-09-23 DIAGNOSIS — L821 Other seborrheic keratosis: Secondary | ICD-10-CM | POA: Diagnosis not present

## 2018-09-23 DIAGNOSIS — Z08 Encounter for follow-up examination after completed treatment for malignant neoplasm: Secondary | ICD-10-CM | POA: Diagnosis not present

## 2018-11-09 ENCOUNTER — Ambulatory Visit: Payer: Medicare HMO | Admitting: Neurology

## 2018-11-13 NOTE — Progress Notes (Signed)
PATIENT: John Pace DOB: Oct 19, 1950  REASON FOR VISIT: follow up HISTORY FROM: patient  Chief Complaint  Patient presents with  . Follow-up    6 month follow up. Wife present. Rm 9. Patient mentioned that his tremors have increased since his last office visit. Especially in his legs. He also mentioned that he has some joint stiffnes when he sits for too long.      HISTORY OF PRESENT ILLNESS: Today 11/17/18 John Pace is a 68 y.o. male here today for follow up tremor. He feels that this is worsening. He has noticed it more in bilateral hands and right leg. He feels it is worse with stress. He has to take more time with writing. Taking a deep breath and relaxing helps to decrease tremor. He has not had any loss of function. He is able to play his guitar and play golf. He is not sure about treating at this time. His dad did develop a tremor in his 26's.    HISTORY: (copied from [place] note on 05/07/2018) John Pace is a 68 year old male with a history of.  He returns today for follow-up.  He feels that his tremor has remained stable.  He notices it primarily with his handwriting and when he is holding objects in his hand. Tremor seems to be worse in the right and the left hand.  He is able to complete all ADLs independently.  He is now retired.  He denies any significant changes with his gait or balance.  He reports on occasion he will stumble but this is rare denies any falls.  No trouble swallowing.  No rigidity.  He returns today for evaluation.   REVIEW OF SYSTEMS: Out of a complete 14 system review of symptoms, the patient complains only of the following symptoms, joint pain, tremors and all other reviewed systems are negative.  ALLERGIES: No Known Allergies  HOME MEDICATIONS: Outpatient Medications Prior to Visit  Medication Sig Dispense Refill  . ibuprofen (ADVIL,MOTRIN) 200 MG tablet Take 200 mg by mouth every 8 (eight) hours as needed.     No  facility-administered medications prior to visit.     PAST MEDICAL HISTORY: Past Medical History:  Diagnosis Date  . High cholesterol   . Tremor     PAST SURGICAL HISTORY: Past Surgical History:  Procedure Laterality Date  . COLONOSCOPY  2017  . STOMACH SURGERY  1952   pyloic stenosis    FAMILY HISTORY: Family History  Problem Relation Age of Onset  . Stroke Father   . Lung cancer Mother     SOCIAL HISTORY: Social History   Socioeconomic History  . Marital status: Married    Spouse name: Peri  . Number of children: 4  . Years of education: 11  . Highest education level: Not on file  Occupational History  . Occupation: Tree surgeon for [employer]  . Financial resource strain: Not on file  . Food insecurity:    Worry: Not on file    Inability: Not on file  . Transportation needs:    Medical: Not on file    Non-medical: Not on file  Tobacco Use  . Smoking status: Never Smoker  . Smokeless tobacco: Never Used  Substance and Sexual Activity  . Alcohol use: Yes    Alcohol/week: 0.0 standard drinks    Comment: 1 beer per week  . Drug use: No  . Sexual activity: Not on file  Lifestyle  . Physical activity:  Days per week: Not on file    Minutes per session: Not on file  . Stress: Not on file  Relationships  . Social connections:    Talks on phone: Not on file    Gets together: Not on file    Attends religious service: Not on file    Active member of club or organization: Not on file    Attends meetings of clubs or organizations: Not on file    Relationship status: Not on file  . Intimate partner violence:    Fear of current or ex partner: Not on file    Emotionally abused: Not on file    Physically abused: Not on file    Forced sexual activity: Not on file  Other Topics Concern  . Not on file  Social History Narrative   Lives at home w/ his wife and 59 yr old daughter   Right-handed   Drinks about 8 oz coffee daily       PHYSICAL EXAM  Vitals:   11/17/18 0853  BP: 125/62  Pulse: (!) 46  Weight: 176 lb 9.6 oz (80.1 kg)  Height: 5\' 6"  (1.676 m)   Body mass index is 28.5 kg/m.  Generalized: Well developed, in no acute distress  Cardiology: normal rate and rhythm, no murmur noted Neurological examination  Mentation: Alert oriented to time, place, history taking. Follows all commands speech and language fluent Cranial nerve II-XII: Pupils were equal round reactive to light. Extraocular movements were full, visual field were full on confrontational test. Facial sensation and strength were normal. Uvula tongue midline. Head turning and shoulder shrug  were normal and symmetric. Motor: The motor testing reveals 5 over 5 strength of all 4 extremities. Good symmetric motor tone is noted throughout. No bradykinesia noted, toe taps and finger taps symmetric and rhythmic  Sensory: Sensory testing is intact to soft touch on all 4 extremities. No evidence of extinction is noted.  Coordination: Cerebellar testing reveals good finger-nose-finger and heel-to-shin bilaterally.  Gait and station: Gait is normal.  Reflexes: Deep tendon reflexes are symmetric and normal bilaterally.   DIAGNOSTIC DATA (LABS, IMAGING, TESTING) - I reviewed patient records, labs, notes, testing and imaging myself where available.  No flowsheet data found.   No results found for: WBC, HGB, HCT, MCV, PLT No results found for: NA, K, CL, CO2, GLUCOSE, BUN, CREATININE, CALCIUM, PROT, ALBUMIN, AST, ALT, ALKPHOS, BILITOT, GFRNONAA, GFRAA No results found for: CHOL, HDL, LDLCALC, LDLDIRECT, TRIG, CHOLHDL No results found for: HGBA1C No results found for: VITAMINB12 No results found for: TSH    ASSESSMENT AND PLAN 68 y.o. year old male  has a past medical history of High cholesterol and Tremor. here with     ICD-10-CM   1. Tremor R25.1     We have discussed diagnosis of essential tremor and potential treatment options at today's  visit. He does not feel tremor has effected his overall functioning and is hesitant about taking medications. I have also suggested he research deep brian stimulation as a potential option for treatment. He is requesting to see Dr Jannifer Franklin at next appt that he already has scheduled for 03/29/2019. He was advised to call sooner for any worsening of symptoms.    No orders of the defined types were placed in this encounter.    No orders of the defined types were placed in this encounter.     I spent 15 minutes with the patient. 50% of this time was spent counseling and educating  patient on plan of care and medications.    Debbora Presto, FNP-C 11/17/2018, 9:30 AM [facility] [address] [place], [place] [address] [phone]

## 2018-11-17 ENCOUNTER — Ambulatory Visit: Payer: Medicare HMO | Admitting: Family Medicine

## 2018-11-17 ENCOUNTER — Encounter: Payer: Self-pay | Admitting: Family Medicine

## 2018-11-17 VITALS — BP 125/62 | HR 46 | Ht 66.0 in | Wt 176.6 lb

## 2018-11-17 DIAGNOSIS — R251 Tremor, unspecified: Secondary | ICD-10-CM | POA: Diagnosis not present

## 2018-11-17 NOTE — Patient Instructions (Addendum)
Continue to monitor tremor  Consider medications for treatment as listed in AVS  Research Deep Brain Stimulation  Follow up with Dr Jannifer Franklin in July as requested    Essential Tremor A tremor is trembling or shaking that a person cannot control. Most tremors affect the hands or arms. Tremors can also affect the head, vocal cords, legs, and other parts of the body. Essential tremor is a tremor without a known cause. Usually, it occurs while a person is trying to perform an action. It tends to get worse gradually as a person ages. What are the causes? The cause of this condition is not known. What increases the risk? You are more likely to develop this condition if:  You have a family member with essential tremor.  You are age 70 or older.  You take certain medicines. What are the signs or symptoms? The main sign of a tremor is a rhythmic shaking of certain parts of your body that is uncontrolled and unintentional. You may:  Have difficulty eating with a spoon or fork.  Have difficulty writing.  Nod your head up and down or side to side.  Have a quivering voice. The shaking may:  Get worse over time.  Come and go.  Be more noticeable on one side of your body.  Get worse due to stress, fatigue, caffeine, and extreme heat or cold. How is this diagnosed? This condition may be diagnosed based on:  Your symptoms and medical history.  A physical exam. There is no single test to diagnose an essential tremor. However, your health care provider may order tests to rule out other causes of your condition. These may include:  Blood and urine tests.  Imaging studies of your brain, such as CT scan and MRI.  A test that measures involuntary muscle movement (electromyogram). How is this treated? Treatment for essential tremor depends on the severity of the condition.  Some tremors may go away without treatment.  Mild tremors may not need treatment if they do not affect your  day-to-day life.  Severe tremors may need to be treated using one or more of the following options: ? Medicines. ? Lifestyle changes. ? Occupational or physical therapy. Follow these instructions at home: Lifestyle   Do not use any products that contain nicotine or tobacco, such as cigarettes and e-cigarettes. If you need help quitting, ask your health care provider.  Limit your caffeine intake as told by your health care provider.  Try to get 8 hours of sleep each night.  Find ways to manage your stress that fits your lifestyle and personality. Consider trying meditation or yoga.  Try to anticipate stressful situations and allow extra time to manage them.  If you are struggling emotionally with the effects of your tremor, consider working with a mental health provider. General instructions  Take over-the-counter and prescription medicines only as told by your health care provider.  Avoid extreme heat and extreme cold.  Keep all follow-up visits as told by your health care provider. This is important. Visits may include physical therapy visits. Contact a health care provider if:  You experience any changes in the location or intensity of your tremors.  You start having a tremor after starting a new medicine.  You have tremor with other symptoms, such as: ? Numbness. ? Tingling. ? Pain. ? Weakness.  Your tremor gets worse.  Your tremor interferes with your daily life.  You feel down, blue, or sad for at least 2 weeks in a  row.  Worrying about your tremor and what other people think about you interferes with your everyday life functions, including relationships, work, or school. Summary  Essential tremor is a tremor without a known cause. Usually, it occurs when you are trying to perform an action.  The cause of this condition is not known.  The main sign of a tremor is a rhythmic shaking of certain parts of your body that is uncontrolled and  unintentional.  Treatment for essential tremor depends on the severity of the condition. This information is not intended to replace advice given to you by your health care provider. Make sure you discuss any questions you have with your health care provider. Document Released: 09/23/2014 Document Revised: 09/12/2017 Document Reviewed: 09/12/2017 Elsevier Interactive Patient Education  2019 [employer].   Primidone tablets What is this medicine? PRIMIDONE (PRI mi done) is a barbiturate. This medicine is used to control seizures in certain types of epilepsy. It is not for use in absence (petit mal) seizures. This medicine may be used for other purposes; ask your health care provider or pharmacist if you have questions. COMMON BRAND NAME(S): Mysoline What should I tell my health care provider before I take this medicine? They need to know if you have any of these conditions: -kidney disease -liver disease -porphyria -suicidal thoughts, plans, or attempt; a previous suicide attempt by you or a family member -an unusual or allergic reaction to primidone, phenobarbital, other barbiturates or seizure medications, other medicines, foods, dyes, or preservatives -pregnant or trying to get pregnant -breast-feeding How should I use this medicine? Take this medicine by mouth with a glass of water. Follow the directions on the prescription label. Take your doses at regular intervals. Do not take your medicine more often than directed. Do not stop taking except on the advice of your doctor or health care professional. A special MedGuide will be given to you by the pharmacist with each prescription and refill. Be sure to read this information carefully each time. Contact your pediatrician or health care professional regarding the use of this medication in children. Special care may be needed. While this drug may be prescribed for children for selected conditions, precautions do apply. Overdosage: If you  think you have taken too much of this medicine contact a poison control center or emergency room at once. NOTE: This medicine is only for you. Do not share this medicine with others. What if I miss a dose? If you miss a dose, take it as soon as you can. If it is almost time for your next dose, take only that dose. Do not take double or extra doses. What may interact with this medicine? Do not take this medicine with any of the following medications: -voriconazole This medicine may also interact with the following medications: -cancer-treating medications -cyclosporine -disopyramide -doxycycline -male hormones, including contraceptive or birth control pills -medicines for mental depression, anxiety or other mood problems -medicines for treating HIV infection or AIDS -modafinil -prescription pain medications -quinidine -warfarin This list may not describe all possible interactions. Give your health care provider a list of all the medicines, herbs, non-prescription drugs, or dietary supplements you use. Also tell them if you smoke, drink alcohol, or use illegal drugs. Some items may interact with your medicine. What should I watch for while using this medicine? Visit your doctor or health care professional for regular checks on your progress. It may be 2 to 3 weeks before you see the full effects of this medicine. Do  not suddenly stop taking this medicine, you may increase the risk of seizures. Your doctor or health care professional may want to gradually reduce the dose. Wear a medical identification bracelet or chain to say you have epilepsy, and carry a card that lists all your medications. You may get drowsy or dizzy. Do not drive, use machinery, or do anything that needs mental alertness until you know how this medicine affects you. Do not stand or sit up quickly, especially if you are an older patient. This reduces the risk of dizzy or fainting spells. Alcohol may interfere with the effect  of this medicine. Avoid alcoholic drinks. Birth control pills may not work properly while you are taking this medicine. Talk to your doctor about using an extra method of birth control. The use of this medicine may increase the chance of suicidal thoughts or actions. Pay special attention to how you are responding while on this medicine. Any worsening of mood, or thoughts of suicide or dying should be reported to your health care professional right away. Women who become pregnant while using this medicine may enroll in the [place] Pregnancy Registry by calling [phone]. This registry collects information about the safety of antiepileptic drug use during pregnancy. This medicine may cause a decrease in vitamin D and folic acid. You should make sure that you get enough vitamins while you are taking this medicine. Discuss the foods you eat and the vitamins you take with your health care professional. What side effects may I notice from receiving this medicine? Side effects that you should report to your doctor or health care professional as soon as possible: -allergic reactions like skin rash, itching or hives, swelling of the face, lips, or tongue -blurred, double vision, or uncontrollable rolling or movements of the eyes -redness, blistering, peeling or loosening of the skin, including inside the mouth -shortness of breath or difficulty breathing -unusual excitement or restlessness, more likely in children and the elderly -unusually weak or tired -worsening of mood, thoughts or actions of suicide or dying Side effects that usually do not require medical attention (report to your doctor or health care professional if they continue or are bothersome): -clumsiness, unsteadiness, or a hang-over effect -decreased sexual ability -dizziness, drowsiness -loss of appetite -nausea or vomiting This list may not describe all possible side effects. Call your doctor for medical  advice about side effects. You may report side effects to FDA at 1-800-FDA-1088. Where should I keep my medicine? Keep out of the reach of children. This medicine may cause accidental overdose and death if it taken by other adults, children, or pets. Mix any unused medicine with a substance like cat litter or coffee grounds. Then throw the medicine away in a sealed container like a sealed bag or a coffee can with a lid. Do not use the medicine after the expiration date. Store at room temperature between 15 and 30 degrees C (59 and 86 degrees F). NOTE: This sheet is a summary. It may not cover all possible information. If you have questions about this medicine, talk to your doctor, pharmacist, or health care provider.  2019 Elsevier/Gold Standard (2017-04-16 15:21:47)   Propranolol injection What is this medicine? PROPRANOLOL (proe PRAN oh lole) is a beta-blocker. Beta-blockers reduce the workload on the heart and help it to beat more regularly. This medicine is used to treat irregular heart rhythms and may be used during anesthesia. This medicine may be used for other purposes; ask your health care provider  or pharmacist if you have questions. COMMON BRAND NAME(S): Inderal What should I tell my health care provider before I take this medicine? They need to know if you have any of these conditions: -circulation problems, or blood vessel disease -diabetes -history of heart attack or heart disease, vasospastic angina -kidney disease -liver disease -lung or breathing disease, like asthma or emphysema -pheochromocytoma -slow heart rate -thyroid disease -an unusual or allergic reaction to propranolol, other beta-blockers, medicines, foods, dyes, or preservatives -pregnant or trying to get pregnant -breast-feeding How should I use this medicine? This medicine is for injection into a vein. It is given by a health care professional in a hospital or clinic setting. Talk to your pediatrician  regarding the use of this medicine in children. Special care may be needed. Overdosage: If you think you have taken too much of this medicine contact a poison control center or emergency room at once. NOTE: This medicine is only for you. Do not share this medicine with others. What if I miss a dose? This does not apply. What may interact with this medicine? Do not take this medicine with any of the following medications: -feverfew -phenothiazines like chlorpromazine, mesoridazine, prochlorperazine, thioridazine This medicine may also interact with the following medications: -aluminum hydroxide gel -antipyrine -antiviral medicines for HIV or AIDS -barbiturates like phenobarbital -certain medicines for blood pressure, heart disease, irregular heart beat -cimetidine -ciprofloxacin -diazepam -fluconazole -haloperidol -isoniazid -medicines for cholesterol like cholestyramine or colestipol -medicines for mental depression -medicines for migraine headache like almotriptan, eletriptan, frovatriptan, naratriptan, rizatriptan, sumatriptan, zolmitriptan -NSAIDs, medicines for pain and inflammation, like ibuprofen or naproxen -phenytoin -rifampin -teniposide -theophylline -thyroid medicines -tolbutamide -warfarin -zileuton This list may not describe all possible interactions. Give your health care provider a list of all the medicines, herbs, non-prescription drugs, or dietary supplements you use. Also tell them if you smoke, drink alcohol, or use illegal drugs. Some items may interact with your medicine. What should I watch for while using this medicine? Your condition will be monitored carefully while you are receiving this medicine. You may get drowsy or dizzy. Do not drive, use machinery, or do anything that needs mental alertness until you know how this drug affects you. Do not stand or sit up quickly, especially if you are an older patient. This reduces the risk of dizzy or fainting  spells. Alcohol can make you more drowsy and dizzy. Avoid alcoholic drinks. This medicine can affect blood sugar levels. If you have diabetes, check with your doctor or health care professional before you change your diet or the dose of your diabetic medicine. Do not treat yourself for coughs, colds, or pain while you are taking this medicine without asking your doctor or health care professional for advice. Some ingredients may increase your blood pressure. What side effects may I notice from receiving this medicine? Side effects that you should report to your doctor or health care professional as soon as possible: -allergic reactions like skin rash, itching or hives, swelling of the face, lips, or tongue -breathing problems -changes in blood sugar -cold hands or feet -difficulty sleeping, nightmares -dry peeling skin -hallucinations -muscle cramps or weakness -slow heart rate -swelling of the legs and ankles -vomiting Side effects that usually do not require medical attention (report to your doctor or health care professional if they continue or are bothersome): -change in sex drive or performance -diarrhea -dry sore eyes -hair loss -nausea -weak or tired This list may not describe all possible side effects. Call your doctor  for medical advice about side effects. You may report side effects to FDA at 1-800-FDA-1088. Where should I keep my medicine? This drug is given in a hospital or clinic and will not be stored at home. NOTE: This sheet is a summary. It may not cover all possible information. If you have questions about this medicine, talk to your doctor, pharmacist, or health care provider.  2019 Elsevier/Gold Standard (2013-05-07 14:53:29)

## 2018-11-17 NOTE — Progress Notes (Signed)
I have read the note, and I agree with the clinical assessment and plan.  John Pace   

## 2019-03-22 DIAGNOSIS — H35362 Drusen (degenerative) of macula, left eye: Secondary | ICD-10-CM | POA: Diagnosis not present

## 2019-03-22 DIAGNOSIS — H2513 Age-related nuclear cataract, bilateral: Secondary | ICD-10-CM | POA: Diagnosis not present

## 2019-03-22 DIAGNOSIS — H40053 Ocular hypertension, bilateral: Secondary | ICD-10-CM | POA: Diagnosis not present

## 2019-03-29 ENCOUNTER — Other Ambulatory Visit: Payer: Self-pay

## 2019-03-29 ENCOUNTER — Encounter: Payer: Self-pay | Admitting: Neurology

## 2019-03-29 ENCOUNTER — Ambulatory Visit: Payer: Medicare HMO | Admitting: Neurology

## 2019-03-29 VITALS — BP 117/65 | HR 51 | Temp 98.0°F | Ht 66.0 in | Wt 174.0 lb

## 2019-03-29 DIAGNOSIS — R251 Tremor, unspecified: Secondary | ICD-10-CM | POA: Diagnosis not present

## 2019-03-29 MED ORDER — ALPRAZOLAM 0.25 MG PO TABS: 0.2500 mg | ORAL_TABLET | Freq: Three times a day (TID) | ORAL | 2 refills | Status: DC | PRN

## 2019-03-29 NOTE — Progress Notes (Signed)
Reason for visit: Essential tremor  John Pace is an 68 y.o. male  History of present illness:  Mr. Whiteley is a 68 year old right-handed white male with a history of an essential tremor that has been present for approximately 3-1/2 years.  The patient indicates that his father also had tremor when he was in his 83s.  The tremor affects both arms, and the tremor has a resting and action component, the patient mainly is bothered with the tremor when he is doing things such as feeding himself or performing handwriting.  The patient has not noted any tremor involving the head or neck or any tremor affecting the voice.  The patient indicates that the tremor is much worse when he is excited about something, better when he relaxes.  The patient plays golf on a regular basis and the tremor it is beginning to affect his ability to play golf.  The patient is on no medication for tremor.  He has also noted some change in his balance, he feels at times that his right leg is not working as well as the left, he has not had any falls.  Past Medical History:  Diagnosis Date  . High cholesterol   . Tremor     Past Surgical History:  Procedure Laterality Date  . COLONOSCOPY  2017  . STOMACH SURGERY  1952   pyloic stenosis    Family History  Problem Relation Age of Onset  . Stroke Father   . Lung cancer Mother     Social history:  reports that he has never smoked. He has never used smokeless tobacco. He reports current alcohol use. He reports that he does not use drugs.   No Known Allergies  Medications:  Prior to Admission medications   Medication Sig Start Date End Date Taking? Authorizing Provider  ibuprofen (ADVIL,MOTRIN) 200 MG tablet Take 200 mg by mouth every 8 (eight) hours as needed.   Yes [provider]    ROS:  Out of a complete 14 system review of symptoms, the patient complains only of the following symptoms, and all other reviewed systems are negative.  Tremor  Walking problem  Blood pressure 117/65, pulse (!) 51, temperature 98 F (36.7 C), temperature source Temporal, height 5\' 6"  (1.676 m), weight 174 lb (78.9 kg).  Physical Exam  General: The patient is alert and cooperative at the time of the examination.  Skin: No significant peripheral edema is noted.   Neurologic Exam  Mental status: The patient is alert and oriented x 3 at the time of the examination. The patient has apparent normal recent and remote memory, with an apparently normal attention span and concentration ability.   Cranial nerves: Facial symmetry is present. Speech is normal, no aphasia or dysarthria is noted. Extraocular movements are full. Visual fields are full.  Motor: The patient has good strength in all 4 extremities.  Sensory examination: Soft touch sensation is symmetric on the face, arms, and legs.  Coordination: The patient has good finger-nose-finger and heel-to-shin bilaterally.  The patient has minimal tremor with finger-nose-finger, he has no significant tremor with performing handwriting such as drawing a spiral.  Tremor is seen in a bilaterally symmetric fashion mainly as a resting tremor.  No tremor is noted with walking.  Gait and station: The patient has a normal gait. Tandem gait is normal. Romberg is negative. No drift is seen.  The patient has good bilateral arm swing with walking.  Reflexes: Deep tendon reflexes are symmetric.  Assessment/Plan:  1.  Essential tremor  The patient is having increased problem with tremor recently, we will add alprazolam 0.25 mg to take up to 3 times daily if needed for the tremor, he will follow-up in about 6 months.  Jill Alexanders MD 03/29/2019 8:17 AM  [facility] [address] [place], [place] [address]  Phone [phone] Fax [phone]

## 2019-04-10 DIAGNOSIS — Z0100 Encounter for examination of eyes and vision without abnormal findings: Secondary | ICD-10-CM | POA: Diagnosis not present

## 2019-05-13 DIAGNOSIS — H40053 Ocular hypertension, bilateral: Secondary | ICD-10-CM | POA: Diagnosis not present

## 2019-09-27 DIAGNOSIS — L570 Actinic keratosis: Secondary | ICD-10-CM | POA: Diagnosis not present

## 2019-09-27 DIAGNOSIS — C44319 Basal cell carcinoma of skin of other parts of face: Secondary | ICD-10-CM | POA: Diagnosis not present

## 2019-09-27 DIAGNOSIS — L821 Other seborrheic keratosis: Secondary | ICD-10-CM | POA: Diagnosis not present

## 2019-09-28 DIAGNOSIS — Z0000 Encounter for general adult medical examination without abnormal findings: Secondary | ICD-10-CM | POA: Diagnosis not present

## 2019-09-28 DIAGNOSIS — Z136 Encounter for screening for cardiovascular disorders: Secondary | ICD-10-CM | POA: Diagnosis not present

## 2019-09-28 DIAGNOSIS — Z125 Encounter for screening for malignant neoplasm of prostate: Secondary | ICD-10-CM | POA: Diagnosis not present

## 2019-09-28 DIAGNOSIS — G250 Essential tremor: Secondary | ICD-10-CM | POA: Diagnosis not present

## 2019-10-05 ENCOUNTER — Ambulatory Visit: Payer: Medicare HMO | Admitting: Neurology

## 2019-10-05 ENCOUNTER — Other Ambulatory Visit: Payer: Self-pay

## 2019-10-05 ENCOUNTER — Encounter: Payer: Self-pay | Admitting: Neurology

## 2019-10-05 VITALS — BP 120/69 | HR 49 | Temp 97.1°F | Ht 66.0 in | Wt 174.5 lb

## 2019-10-05 DIAGNOSIS — R251 Tremor, unspecified: Secondary | ICD-10-CM

## 2019-10-05 NOTE — Progress Notes (Signed)
Reason for visit: Tremor  John Pace is an 69 y.o. male  History of present illness:  John Pace is a 69 year old right-handed white male with a history of tremor affecting both upper extremities.  The patient over time has noted that the tremor has worsened.  He tried to use alprazolam intermittently but this resulted in too much drowsiness.  He is having increasing problems playing the guitar and playing golf.  If he is upset or nervous, the tremor worsens.  He denies a family history of tremor, neither one of his parents had troubles.  The patient does have some changes in handwriting, he indicates that the handwriting may become small after writing a short time.  He believes that the tremor is worse when he is doing something with his hands, but he clearly has resting tremors in both arms.  The patient has noted that he may occasionally shuffle his feet and stumble, he has not had any falls.  He has not had any change in speech or swallowing.  Past Medical History:  Diagnosis Date  . High cholesterol   . Tremor     Past Surgical History:  Procedure Laterality Date  . COLONOSCOPY  2017  . STOMACH SURGERY  1952   pyloic stenosis    Family History  Problem Relation Age of Onset  . Stroke Father   . Lung cancer Mother     Social history:  reports that he has never smoked. He has never used smokeless tobacco. He reports current alcohol use. He reports that he does not use drugs.   No Known Allergies  Medications:  Prior to Admission medications   Medication Sig Start Date End Date Taking? Authorizing Provider  ibuprofen (ADVIL,MOTRIN) 200 MG tablet Take 200 mg by mouth every 8 (eight) hours as needed.   Yes [provider]    ROS:  Out of a complete 14 system review of symptoms, the patient complains only of the following symptoms, and all other reviewed systems are negative.  Tremor  Blood pressure 120/69, pulse (!) 49, temperature (!) 97.1 F (36.2 C),  height 5\' 6"  (1.676 m), weight 174 lb 8 oz (79.2 kg).  Physical Exam  General: The patient is alert and cooperative at the time of the examination.  Skin: No significant peripheral edema is noted.   Neurologic Exam  Mental status: The patient is alert and oriented x 3 at the time of the examination. The patient has apparent normal recent and remote memory, with an apparently normal attention span and concentration ability.   Cranial nerves: Facial symmetry is present. Speech is normal, no aphasia or dysarthria is noted. Extraocular movements are full. Visual fields are full.  Motor: The patient has good strength in all 4 extremities.  Sensory examination: Soft touch sensation is symmetric on the face, arms, and legs.  Coordination: The patient has good finger-nose-finger and heel-to-shin bilaterally.  Resting tremors are seen on both upper extremities, occasionally, a tremor may be noted with the left lower extremity.  When drawing a spiral, no tremor is translated into handwriting.  No tremor is noted with finger-nose-finger.  Gait and station: The patient has a normal gait, he has symmetric arm swing, tremor in the arms is seen while walking, right greater than left.  Tandem gait is normal. Romberg is negative. No drift is seen.  Reflexes: Deep tendon reflexes are symmetric.   Assessment/Plan:  1.  Tremor  The patient appears to have more of a resting  tremor at this point, I do not see tremor in the handwriting or with finger-nose-finger.  The patient may have some tremor involving the left leg, he reports some micrographia and some occasional stumbles.  These features may be more consistent with Parkinson's disease.  The patient does not have a family history of tremor.  I will set the patient up for a DaTscan, we will treat the tremor medically depending upon the results of the study.  Jill Alexanders MD 10/05/2019 7:58 AM  [facility] [address]  [place], [place] [address]  Phone [phone] Fax [phone]

## 2019-10-12 ENCOUNTER — Telehealth: Payer: Self-pay | Admitting: Neurology

## 2019-10-12 NOTE — Telephone Encounter (Signed)
Pt called wanting an update on when his DAT Scan will be scheduled. Please advise.

## 2019-10-12 NOTE — Telephone Encounter (Signed)
Pt has called stating he called about 3 hours ago to know when his DAT scan will be scheduled.  Pt was told that his message from 9:38 was sent to the scheduler and when she is able she will contact him for the scheduling of his DAT scan.  Pt states he will wait to be contacted.

## 2019-10-12 NOTE — Telephone Encounter (Signed)
Patient needs to sign consent . Patient is coming 10/13/2019

## 2019-11-11 ENCOUNTER — Encounter (HOSPITAL_COMMUNITY)
Admission: RE | Admit: 2019-11-11 | Discharge: 2019-11-11 | Disposition: A | Discharge status: Home / Self Care | Payer: Medicare HMO | Source: Ambulatory Visit | Attending: Neurology | Admitting: Neurology

## 2019-11-11 ENCOUNTER — Other Ambulatory Visit: Payer: Self-pay

## 2019-11-11 ENCOUNTER — Ambulatory Visit (HOSPITAL_COMMUNITY)
Admission: RE | Admit: 2019-11-11 | Discharge: 2019-11-11 | Disposition: A | Discharge status: Home / Self Care | Payer: Medicare HMO | Source: Ambulatory Visit | Attending: Neurology | Admitting: Neurology

## 2019-11-11 DIAGNOSIS — R251 Tremor, unspecified: Secondary | ICD-10-CM | POA: Insufficient documentation

## 2019-11-11 MED ORDER — IODINE STRONG (LUGOLS) 5 % PO SOLN: 0.8000 mL | Freq: Once | ORAL | Status: AC

## 2019-11-11 MED ORDER — IOFLUPANE I 123 185 MBQ/2.5ML IV SOLN
4.9300 | Freq: Once | INTRAVENOUS | Status: AC
  Administered 2019-11-11: 4.93 via INTRAVENOUS

## 2019-11-11 MED ORDER — IODINE STRONG (LUGOLS) 5 % PO SOLN
ORAL | Status: AC
  Administered 2019-11-11: 0.8 mL via ORAL
  Filled 2019-11-11: qty 1

## 2019-11-12 ENCOUNTER — Telehealth: Payer: Self-pay | Admitting: Neurology

## 2019-11-12 MED ORDER — BENZTROPINE MESYLATE 0.5 MG PO TABS: 0.5000 mg | ORAL_TABLET | Freq: Two times a day (BID) | ORAL | 2 refills | Status: DC

## 2019-11-12 NOTE — Telephone Encounter (Signed)
I called the patient.  The DaTscan is consistent with Parkinson's disease.  The patient is mainly concerned about his tremor, we will start low-dose Cogentin, eventually go to medications for the parkinsonian symptoms.  The patient is to stay active, he plays golf and walks a mile a day.    DAT scan 11/11/19:  IMPRESSION: 1. Marked decreased activity in the posterior striatum is a pattern consistent with Parkinson's syndrome pathology.  2.  Findings slightly worse on the RIGHT compared to the LEFT.

## 2019-11-14 ENCOUNTER — Ambulatory Visit: Payer: Medicare HMO | Attending: Internal Medicine

## 2019-11-14 DIAGNOSIS — Z23 Encounter for immunization: Secondary | ICD-10-CM

## 2019-11-14 NOTE — Progress Notes (Signed)
.    Covid-19 Vaccination Clinic  Name:  John Pace    MRN: [id number] DOB: December 20, 1950  11/14/2019  Mr. John Pace was observed post Covid-19 immunization for 15 minutes without incidence. He was provided with Vaccine Information Sheet and instruction to access the V-Safe system.   Mr. John Pace was instructed to call 911 with any severe reactions post vaccine: Marland Kitchen Difficulty breathing  . Swelling of your face and throat  . A fast heartbeat  . A bad rash all over your body  . Dizziness and weakness    Immunizations Administered    Name Date Dose VIS Date Route   Pfizer COVID-19 Vaccine 11/14/2019 10:13 AM 0.3 mL 08/27/2019 Intramuscular   Manufacturer: [place]   Lot: [id number]   [place]: [id number]

## 2019-12-08 ENCOUNTER — Ambulatory Visit: Payer: Medicare HMO | Attending: Internal Medicine

## 2019-12-08 DIAGNOSIS — Z23 Encounter for immunization: Secondary | ICD-10-CM

## 2019-12-08 NOTE — Progress Notes (Signed)
   Covid-19 Vaccination Clinic  Name:  John Pace    MRN: [id number] DOB: December 14, 1950  12/08/2019  [name] was observed post Covid-19 immunization for 15 minutes without incident. He was provided with Vaccine Information Sheet and instruction to access the V-Safe system.   [name] was instructed to call 911 with any severe reactions post vaccine: Marland Kitchen Difficulty breathing  . Swelling of face and throat  . A fast heartbeat  . A bad rash all over body  . Dizziness and weakness   Immunizations Administered    Name Date Dose VIS Date Route   Pfizer COVID-19 Vaccine 12/08/2019 12:53 PM 0.3 mL 08/27/2019 Intramuscular   Manufacturer: [place]   Lot: [id number]   [place]: [id number]

## 2020-02-04 ENCOUNTER — Telehealth: Payer: Self-pay | Admitting: Neurology

## 2020-02-04 DIAGNOSIS — G20 Parkinson's disease: Secondary | ICD-10-CM

## 2020-02-04 NOTE — Telephone Encounter (Signed)
Pt called stating that [place] is going to move out of state and he is needing a referral to [place], [place] and also he is needing to speak to the RN or provider about his medications that seem to not be working. Please advise.

## 2020-02-07 MED ORDER — BENZTROPINE MESYLATE 0.5 MG PO TABS: 0.5000 mg | ORAL_TABLET | Freq: Three times a day (TID) | ORAL | 2 refills | Status: AC

## 2020-02-07 NOTE — Telephone Encounter (Signed)
I called the patient.  He is moving to [facility], I will make a referral to [facility] [facility], telephone number is [phone].  They are a movement disorders clinic.  We will go up on the Cogentin to 0.5 mg 3 times daily.

## 2020-02-07 NOTE — Telephone Encounter (Addendum)
Pt called again. Pt was informed that the provider was seeing pt's at the moment and that he will be called in the allowed time.  Please call back when available.

## 2020-02-15 NOTE — Telephone Encounter (Signed)
I have talked to patient's wife and relayed I received referral form from [facility] for Transfer of care because they are moving . Telephone [phone] fax [phone].

## 2020-02-16 NOTE — Telephone Encounter (Signed)
Pt called back to speak with coordinator in regards to referral Cb#[phone]

## 2020-02-16 NOTE — Telephone Encounter (Signed)
Called patient back answered his question about referral.

## 2020-04-03 ENCOUNTER — Ambulatory Visit: Payer: Medicare HMO | Admitting: Neurology

## 2020-05-03 ENCOUNTER — Other Ambulatory Visit: Payer: Self-pay | Admitting: Neurology

## 2021-03-17 IMAGING — NM NM DATSCAN
4 series · 33 of 40 positions shown · non-contrast
Comparison: None.

CLINICAL DATA: 69-year-old male. Bilateral hand tremors. Worsening
of symptoms.

EXAM:
NUCLEAR MEDICINE BRAIN IMAGING WITH SPECT  (DaTscan )
TECHNIQUE: SPECT images of the brain were obtained after intravenous injection
of radiopharmaceutical. 4 hour post injection imaging. Appropriate
positioning. 0.8 ml lugols solution administered in a.m
RADIOPHARMACEUTICALS:  4.9 millicuries I 123 Ioflupane

[Series 1: brain spect · 4.14mm/px · 5 of 120 frames shown]
[frame 11/120  full-range]
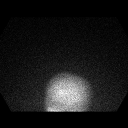
[frame 31/120  full-range]
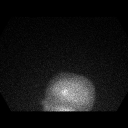
[frame 71/120  full-range]
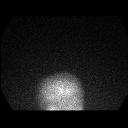
[frame 91/120  full-range]
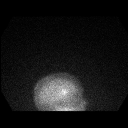
[frame 111/120  full-range]
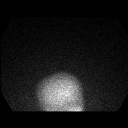

[Series 1: spect - (id) _(id)_tra · 4.1mm · 4.14mm/px · 5 of 128 frames shown]
[frame 11/128]
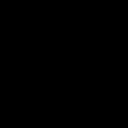
[frame 32/128]
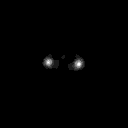
[frame 75/128]
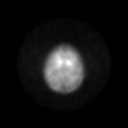
[frame 96/128]
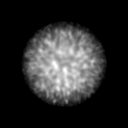
[frame 118/128]
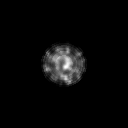

[Series 1: spect - (id) _(id)_cor · 4.1mm · 4.14mm/px · 5 of 128 frames shown]
[frame 11/128]
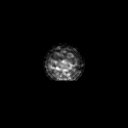
[frame 32/128]
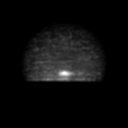
[frame 75/128]
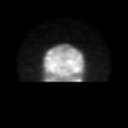
[frame 96/128]
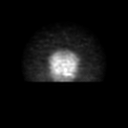
[frame 118/128]
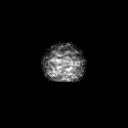

[Series 1016: mpr tra (id) range · 1.01mm/px · 18 of 40 slices shown]
[im 1/40]
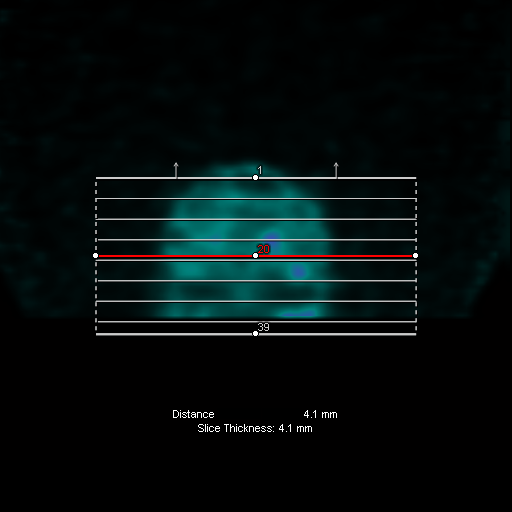
[im 4/40]
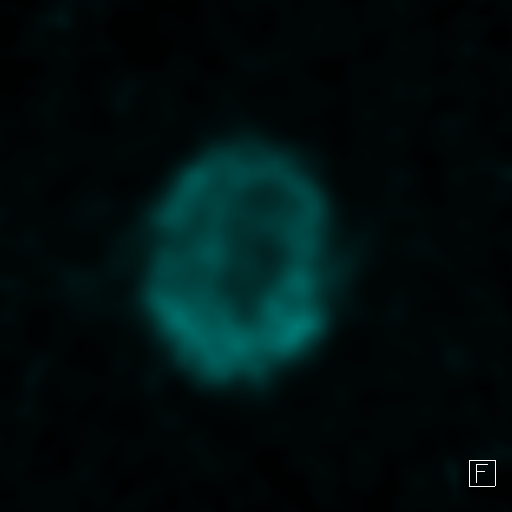
[im 6/40]
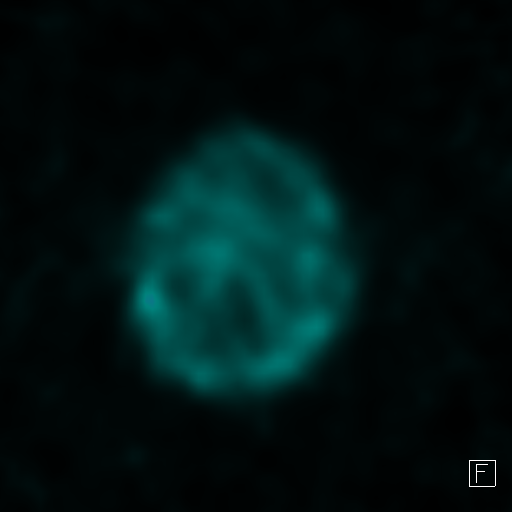
[im 8/40]
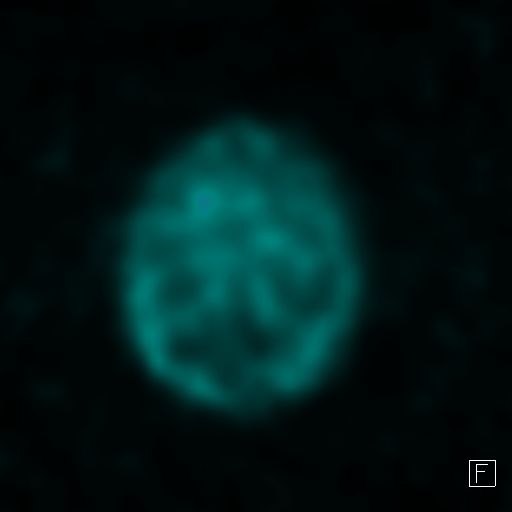
[im 10/40]
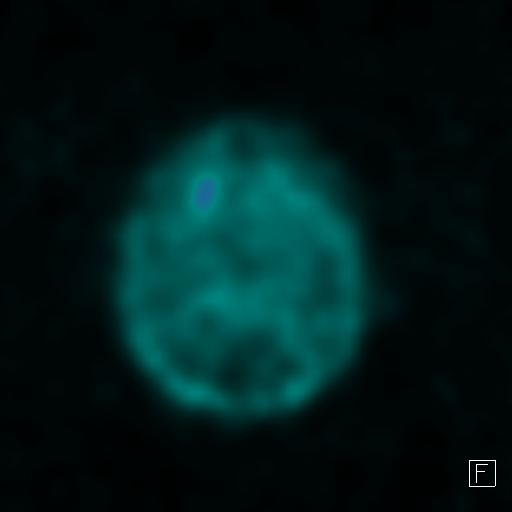
[im 12/40]
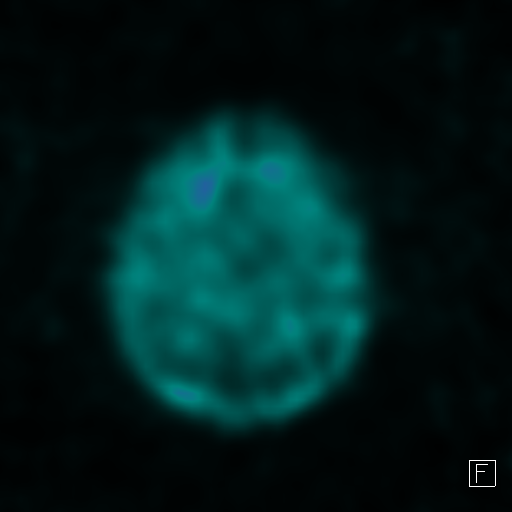
[im 15/40]
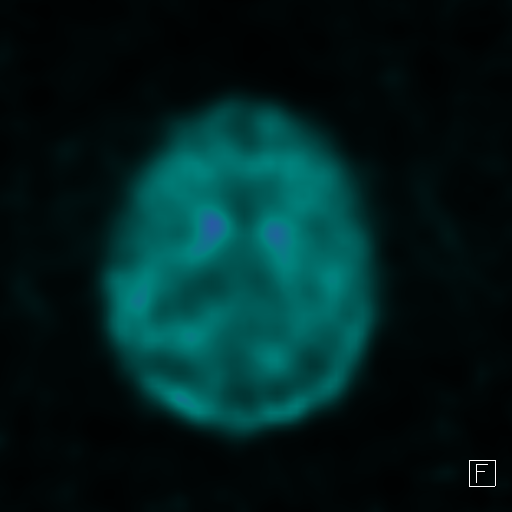
[im 17/40]
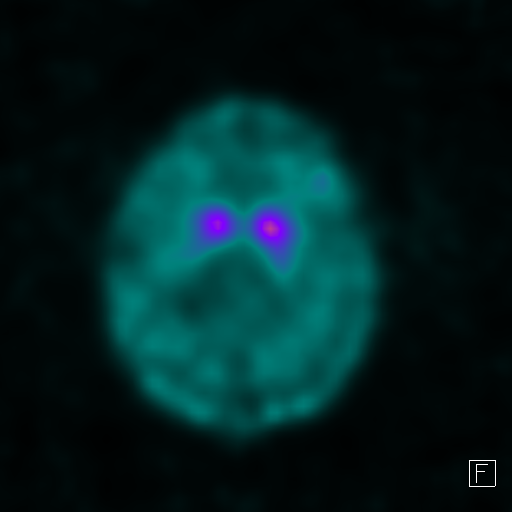
[im 19/40]
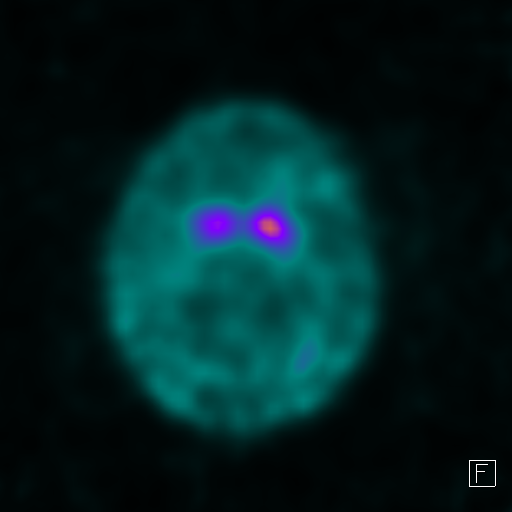
[im 21/40]
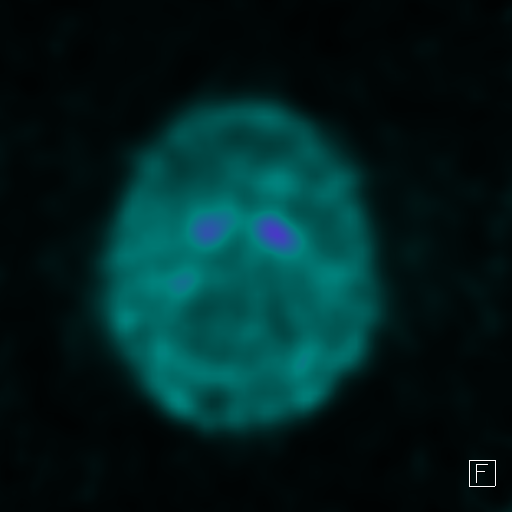
[im 23/40]
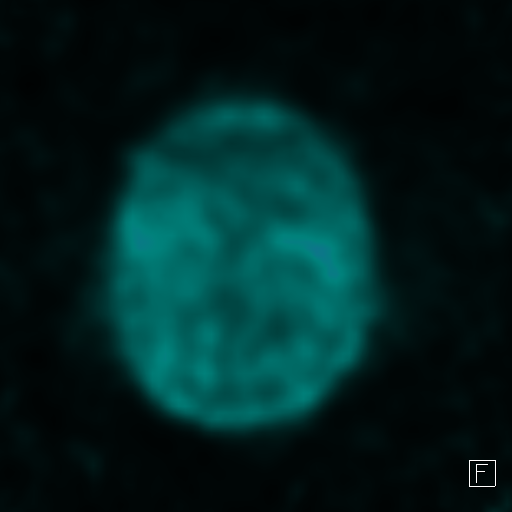
[im 27/40]
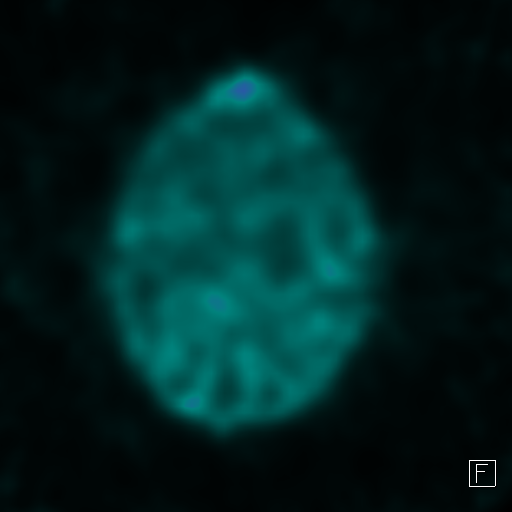
[im 28/40]
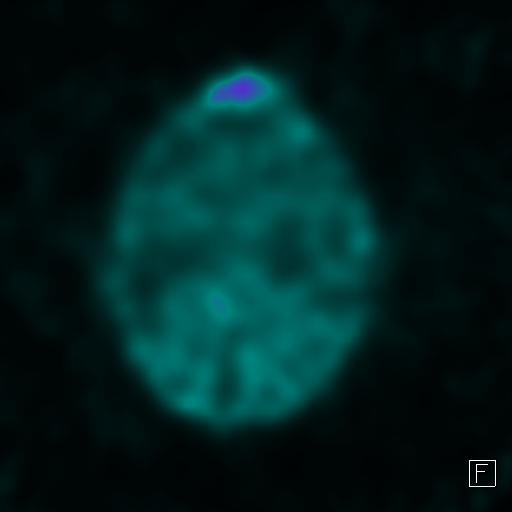
[im 30/40]
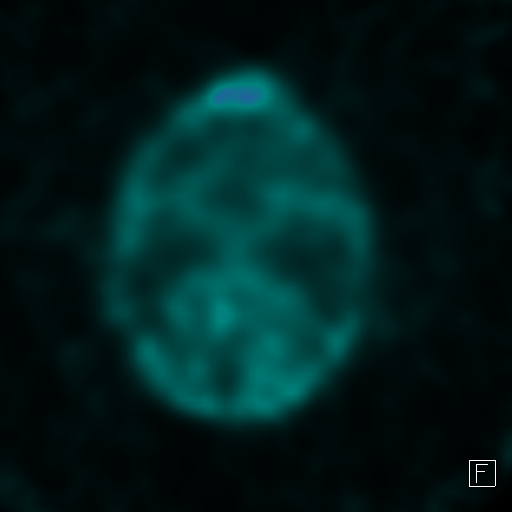
[im 32/40]
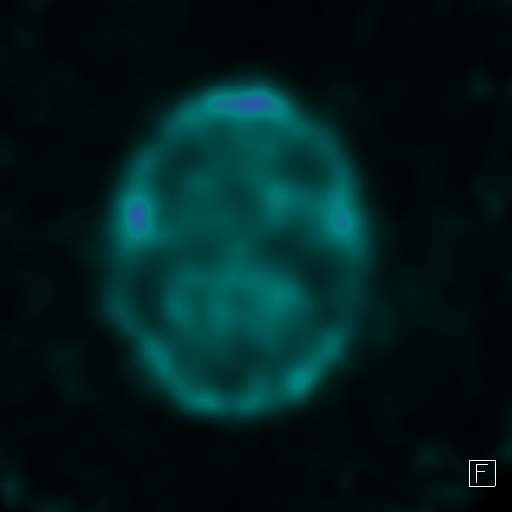
[im 34/40]
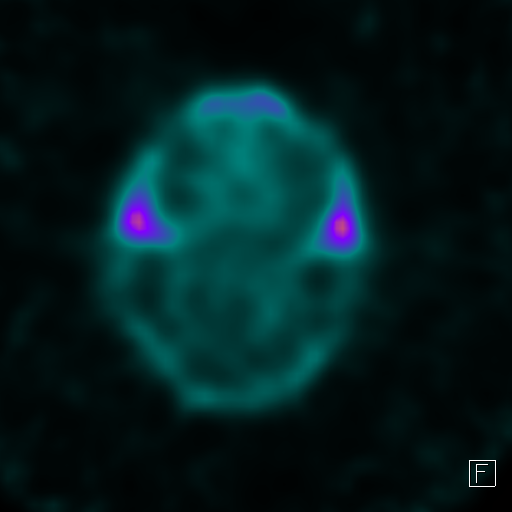
[im 38/40]
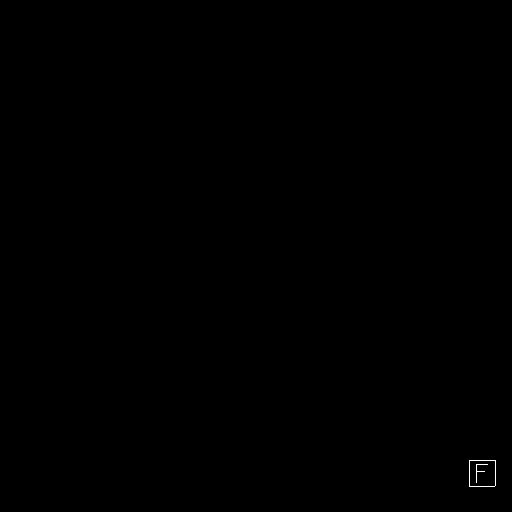
[im 40/40]
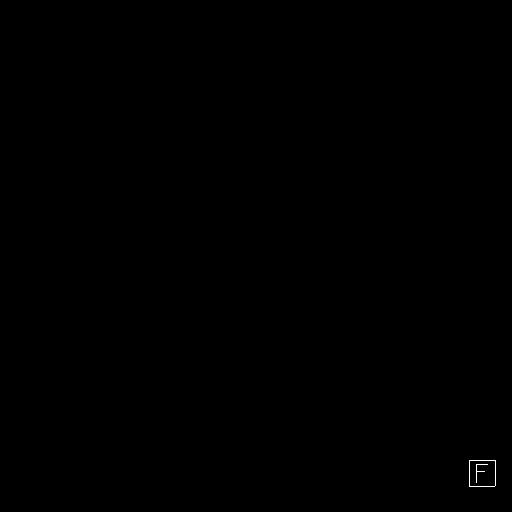

[33 of 40 positions shown; findings below may reference images not displayed]

FINDINGS: There is symmetric decreased activity (near absent) within the
posterior LEFT and RIGHT striatum (putamen). There is decreased
activity in the head of the RIGHT caudate nucleus compared to the
LEFT. Decreased striatal activity consistent with loss of dopamine
transporter populations.
IMPRESSION: 1. Marked decreased activity in the posterior striatum is a pattern
consistent with Parkinson's syndrome pathology.

2.  Findings slightly worse on the RIGHT compared to the LEFT.
